# Patient Record
Sex: Male | Born: 1990 | Race: White | Hispanic: No | Marital: Single
Health system: Southern US, Community
[De-identification: ages and names within clinical notes are randomized; demographics above are authoritative.]

---
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
## 2016-03-05 ENCOUNTER — Emergency Department
Admission: EM | Admit: 2016-03-05 | Discharge: 2016-03-05 | Disposition: A | Discharge status: Home / Self Care | Payer: Worker's Compensation | Attending: Emergency Medicine | Admitting: Emergency Medicine

## 2016-03-05 DIAGNOSIS — X58XXXA Exposure to other specified factors, initial encounter: Secondary | ICD-10-CM | POA: Insufficient documentation

## 2016-03-05 DIAGNOSIS — S0591XA Unspecified injury of right eye and orbit, initial encounter: Secondary | ICD-10-CM | POA: Diagnosis not present

## 2016-03-05 DIAGNOSIS — T2691XA Corrosion of right eye and adnexa, part unspecified, initial encounter: Secondary | ICD-10-CM

## 2016-03-05 DIAGNOSIS — Y9389 Activity, other specified: Secondary | ICD-10-CM | POA: Diagnosis not present

## 2016-03-05 DIAGNOSIS — Y999 Unspecified external cause status: Secondary | ICD-10-CM | POA: Diagnosis not present

## 2016-03-05 DIAGNOSIS — Y929 Unspecified place or not applicable: Secondary | ICD-10-CM | POA: Diagnosis not present

## 2016-03-05 DIAGNOSIS — Z77098 Contact with and (suspected) exposure to other hazardous, chiefly nonmedicinal, chemicals: Secondary | ICD-10-CM | POA: Diagnosis not present

## 2016-03-05 NOTE — Consult Note (Signed)
  Reason for Consult:Acid in eye Referring Physician: Shaune PollackLord - ER  Eric Swanson is an 25 y.o. male.  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  Took out CTL and immediately flushed about 10 min with water and proceeded to ER.  Morgan lens placed and irrigated w 1 liter NS.  Started 2nd liter- interrupted for exam.  No past medical history on file.  PMH - Seasonal allergies  ROS  No past surgical history on file.  None  No family history on file. FOH - none Social History:  has no tobacco, alcohol, and drug history on file.  Denies all of above.  Allergies: No Known Allergies  Medications: I have reviewed the patient's current medications. Zyrtec No results found for this or any previous visit (from the past 48 hour(s)).  No results found.  Blood pressure 138/109, pulse 82, temperature 98.3 F (36.8 C), temperature source Oral, resp. rate 18, height 6\' 3"  (1.905 m), weight 92.987 kg (205 lb), SpO2 98 %.  Mental status: Alert and Oriented x 4  Visual Acuity:  20/cf easily OD  20/cf easily near [place]  Pupils:  Equally round/ reactive to light.  No Afferent defect.  Motility:  Full/ orthophoric  Visual Fields:  Full to confrontation  IOP:  deferred  External/ Lids/ Lashes:  Normal  Anterior Segment: PH 7 at 2 min and 10 min post- irrigation  Conjunctiva:  2+ inj OD OU  Cornea:  Normal  OU  No epi defect   Anterior Chamber: Normal  OU  Lens:   Normal OU  Deferred posterior segment exam - good red reflex O    Assessment/Plan: Acid/ Chemical injury OD PH normalized with irrigation EMycin ung q2 hrs  F/u 1 day at [facility]  Lillyona Polasek 03/05/2016, 1:14 PM

## 2016-03-05 NOTE — ED Notes (Signed)
Pt brought back to eye wash station to continue rinsing eye

## 2016-03-05 NOTE — Discharge Instructions (Signed)
You were evaluated after acid splashed into your eye, and your exam and evaluation are reassuring.  If you're still having any symptoms, follow-up with ophthalmologist in about one week.  For pain take over-the-counter Tylenol or ibuprofen.

## 2016-03-05 NOTE — ED Provider Notes (Signed)
[facility] Emergency Department Provider Note   ____________________________________________  Time seen:  I have reviewed the triage vital signs and the triage nursing note.  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 He washed his eye at work for about 5 minutes. He is complaining of some mild eye pain, and decreased right eye vision. No additional traumatic injury or chemical exposureelsewhere. Symptoms are moderate.  Wears contacts, took contact out at work.    No past medical history on file.  There are no active problems to display for this patient.   No past surgical history on file.  No current outpatient prescriptions on file.  Allergies Review of patient's allergies indicates no known allergies.  No family history on file.  Social History Social History  Substance Use Topics  . Smoking status: Not on file  . Smokeless tobacco: Not on file  . Alcohol Use: Not on file    Review of Systems  Constitutional: Negative for recent illnesses. Eyes: Decreased vision right eye. ENT: Negative for sore throat. Cardiovascular: Negative for chest pain. Respiratory: Negative for shortness of breath. Gastrointestinal: Negative for abdominal pain, vomiting and diarrhea. Genitourinary: Negative for dysuria. Musculoskeletal: Negative for back pain. Skin: Negative for rash. Neurological: Negative for headache. 10 point Review of Systems otherwise negative ____________________________________________   PHYSICAL EXAM:  VITAL SIGNS: ED Triage Vitals  Enc Vitals Group     BP 03/05/16 1159 138/109 mmHg     Pulse Rate 03/05/16 1159 82     Resp 03/05/16 1159 18     Temp 03/05/16 1159 98.3 F (36.8 C)     Temp Source 03/05/16 1159 Oral     SpO2 03/05/16 1159 98 %     Weight  03/05/16 1159 205 lb (92.987 kg)     Height 03/05/16 1159 6\' 3"  (1.905 m)     Head Cir --      Peak Flow --      Pain Score 03/05/16 1200 2     Pain Loc --      Pain Edu? --      Excl. in GC? --      Constitutional: Alert and oriented. Well appearing and in no distress. HEENT   Head: Normocephalic and atraumatic.      Eyes: Right eye conjunctiva injected.  To my naked eye, clear cornea, no cloudiness. Pupil is round and reactive to light. Normal  extraocular movements. Visual acuity done by nurse, 20/20 in the left eye, 20/200 in the right eye. Left eye normal appearance      Ears:         Nose: No congestion/rhinnorhea.   Mouth/Throat: Mucous membranes are moist.   Neck: No stridor. Cardiovascular/Chest: Normal rate, regular rhythm.  No murmurs, rubs, or gallops. Respiratory: Normal respiratory effort without tachypnea nor retractions. Breath sounds are clear and equal bilaterally. No wheezes/rales/rhonchi. Gastrointestinal: Soft. No distention, no guarding, no rebound. Nontender.    Genitourinary/rectal:Deferred Musculoskeletal: Nontender with normal range of motion in all extremities. No joint effusions.  No lower extremity tenderness.  No edema. Neurologic:  Normal speech and language. No gross or focal neurologic deficits are appreciated. Skin:  Skin is warm, dry and intact. No rash noted.  ____________________________________________   EKG I, Governor Rooksebecca Lumir Demetriou, MD, the attending physician have personally viewed and  interpreted all ECGs.  None ____________________________________________  LABS (pertinent positives/negatives)  None  ____________________________________________  RADIOLOGY All Xrays were viewed by me. Imaging interpreted by Radiologist.  None __________________________________________  PROCEDURES  Procedure(s) performed: Morgan's lens placed by nurse for right eye irrigation, 2L NS.  Critical Care performed:  None  ____________________________________________   ED COURSE / ASSESSMENT AND PLAN  Pertinent labs & imaging results that were available during my care of the patient were reviewed by me and considered in my medical decision making (see chart for details).   Reports acid chemical exposure to the right eye with some discomfort, and decreased vision.  Morgan lens was placed and a liter bolus was initiated. I spoke by phone with on-call ophthalmologist, Dr. Inez Pilgrim he will come to the ED to evaluate the patient. He recommended 2 L and then recheck the pH.  Dr. Inez Pilgrim did come in and evaluate the patient's eye with fluorescein, and checked the pH which is neutral. He recommends no additional emergency treatments. Patient may be discharged.  I did discuss with the patient to follow-up with ophthalmologist if he has any ongoing symptoms next week.    CONSULTATIONS:   Dr. Inez Pilgrim, ophthalmology.   Patient / Family / Caregiver informed of clinical course, medical decision-making process, and agree with plan.   I discussed return precautions, follow-up instructions, and discharged instructions with patient and/or family.   ___________________________________________   FINAL CLINICAL IMPRESSION(S) / ED DIAGNOSES   Final diagnoses:  Chemical insult, eye, right, initial encounter              Note: This dictation was prepared with Dragon dictation. Any transcriptional errors that result from this process are unintentional   Governor Rooks, MD 03/05/16 1422

## 2016-03-05 NOTE — ED Notes (Addendum)
Pt placed on [employer] L[employer].  Denies chgs in vision

## 2016-03-05 NOTE — ED Notes (Signed)
Pt states that he was moving lawn mower batteries and one hit hard and acid splashed into his rt eye, pt states that he began washing the eye at work with water, approx 5 min.
